# Patient Record
Sex: Female | Born: 1960 | Race: White | Hispanic: No | Smoking: Current every day smoker
Health system: Southern US, Community
[De-identification: ages and names within clinical notes are randomized; demographics above are authoritative.]

## PROBLEM LIST (undated history)

## (undated) HISTORY — PX: BREAST ENHANCEMENT SURGERY: SHX7

---

## 2016-12-08 ENCOUNTER — Ambulatory Visit (HOSPITAL_COMMUNITY)
Admission: EM | Admit: 2016-12-08 | Discharge: 2016-12-08 | Disposition: A | Discharge status: Home / Self Care | Payer: 59 | Attending: Family Medicine | Admitting: Family Medicine

## 2016-12-08 ENCOUNTER — Encounter (HOSPITAL_COMMUNITY): Payer: Self-pay | Admitting: *Deleted

## 2016-12-08 DIAGNOSIS — H6983 Other specified disorders of Eustachian tube, bilateral: Secondary | ICD-10-CM

## 2016-12-08 DIAGNOSIS — R42 Dizziness and giddiness: Secondary | ICD-10-CM | POA: Diagnosis not present

## 2016-12-08 MED ORDER — IPRATROPIUM BROMIDE 0.06 % NA SOLN: 2.0000 | Freq: Four times a day (QID) | NASAL | 0 refills | Status: AC

## 2016-12-08 MED ORDER — MECLIZINE HCL 25 MG PO TABS: 25.0000 mg | ORAL_TABLET | Freq: Three times a day (TID) | ORAL | 0 refills | Status: AC | PRN

## 2016-12-08 NOTE — ED Provider Notes (Signed)
  [facility]   [id number] 12/08/16 Arrival Time: 1705  ASSESSMENT & PLAN:  1. Dizziness   2. ETD (Eustachian tube dysfunction), bilateral     Meds ordered this encounter  Medications  . meclizine (ANTIVERT) 25 MG tablet    Sig: Take 1 tablet (25 mg total) by mouth 3 (three) times daily as needed for dizziness.    Dispense:  30 tablet    Refill:  0    Order Specific Question:   Supervising Provider    Answer:   Mardella LaymanHAGLER, BRIAN [id number]]  . ipratropium (ATROVENT) 0.06 % nasal spray    Sig: Place 2 sprays into both nostrils 4 (four) times daily.    Dispense:  15 mL    Refill:  0    Order Specific Question:   Supervising Provider    Answer:   Mardella LaymanHAGLER, BRIAN [[id number]]    Reviewed expectations re: course of current medical issues. Questions answered. Outlined signs and symptoms indicating need for more acute intervention. Patient verbalized understanding. After Visit Summary given.   SUBJECTIVE:  Julie Weeks is a 56 y.o. female who presents with complaint of bilateral ear discomfort, uri sx's, and dizziness.  ROS: As per HPI.   OBJECTIVE:  Vitals:   12/08/16 1802  BP: 114/66  Pulse: 83  Resp: 16  Temp: 98.2 F (36.8 C)  TempSrc: Oral  SpO2: 99%     General appearance: alert; no distress HEENT: normocephalic; atraumatic; conjunctivae normal; TMs normal; nasal mucosa normal; oral mucosa normal Neck: supple Lungs: clear to auscultation bilaterally Heart: regular rate and rhythm Abdomen: soft, non-tender; bowel sounds normal; no masses or organomegaly; no guarding or rebound tenderness Back: no CVA tenderness Extremities: no cyanosis or edema; symmetrical with no gross deformities Skin: warm and dry Neurologic: normal symmetric reflexes; normal gait Psychological:  alert and cooperative; normal mood and affect  No results found for this or any previous visit.  Labs Reviewed - No data to display  No results found.  No Known Allergies  PMHx,  SurgHx, SocialHx, Medications, and Allergies were reviewed in the Visit Navigator and updated as appropriate.      Julie Canter[place], Julie Weeks, [place]NP 12/08/16 [phone]

## 2016-12-08 NOTE — ED Triage Notes (Addendum)
Patient reports dizziness that started today, does not change with positions per patient. Stroke scale negative. Reports mild discomfort to bilateral ears.

## 2017-01-21 ENCOUNTER — Other Ambulatory Visit: Payer: Self-pay

## 2017-01-21 ENCOUNTER — Emergency Department (HOSPITAL_COMMUNITY): Payer: 59

## 2017-01-21 ENCOUNTER — Emergency Department (HOSPITAL_COMMUNITY)
Admission: EM | Admit: 2017-01-21 | Discharge: 2017-01-21 | Disposition: A | Discharge status: Home / Self Care | Payer: 59 | Attending: Physician Assistant | Admitting: Physician Assistant

## 2017-01-21 ENCOUNTER — Encounter (HOSPITAL_COMMUNITY): Payer: Self-pay | Admitting: Emergency Medicine

## 2017-01-21 DIAGNOSIS — F17210 Nicotine dependence, cigarettes, uncomplicated: Secondary | ICD-10-CM | POA: Diagnosis not present

## 2017-01-21 DIAGNOSIS — Z79899 Other long term (current) drug therapy: Secondary | ICD-10-CM | POA: Insufficient documentation

## 2017-01-21 DIAGNOSIS — R0789 Other chest pain: Secondary | ICD-10-CM

## 2017-01-21 DIAGNOSIS — R079 Chest pain, unspecified: Secondary | ICD-10-CM | POA: Diagnosis present

## 2017-01-21 LAB — CBC
HEMATOCRIT: 43.8 % (ref 36.0–46.0)
HEMOGLOBIN: 14.4 g/dL (ref 12.0–15.0)
MCH: 29.2 pg (ref 26.0–34.0)
MCHC: 32.9 g/dL (ref 30.0–36.0)
MCV: 88.8 fL (ref 78.0–100.0)
Platelets: 284 10*3/uL (ref 150–400)
RBC: 4.93 MIL/uL (ref 3.87–5.11)
RDW: 12.9 % (ref 11.5–15.5)
WBC: 6.3 10*3/uL (ref 4.0–10.5)

## 2017-01-21 LAB — BASIC METABOLIC PANEL
ANION GAP: 6 (ref 5–15)
BUN: 7 mg/dL (ref 6–20)
CO2: 27 mmol/L (ref 22–32)
Calcium: 9.5 mg/dL (ref 8.9–10.3)
Chloride: 104 mmol/L (ref 101–111)
Creatinine, Ser: 0.96 mg/dL (ref 0.44–1.00)
GFR calc Af Amer: >60 mL/min (ref >60)
GLUCOSE: 112 mg/dL — AB (ref 65–99)
Potassium: 3.8 mmol/L (ref 3.5–5.1)
Sodium: 137 mmol/L (ref 135–145)

## 2017-01-21 LAB — I-STAT TROPONIN, ED
TROPONIN I, POC: 0 ng/mL (ref 0.00–0.08)
Troponin i, poc: 0 ng/mL (ref 0.00–0.08)

## 2017-01-21 NOTE — Discharge Instructions (Signed)
Please follow-up with your primary care physician and you may need to see a cardiologist.  Please return with any chest pain or worsening symtpoms.   We are unsure what is causing your chest pain, at this time we do not believe that this is a heart attack. However things may change so please pay attention to your symptoms and return as needed.

## 2017-01-21 NOTE — ED Provider Notes (Signed)
[facility] Provider Note   CSN: [id number] Arrival date & time: 01/21/17  1138     History   Chief Complaint Chief Complaint  Patient presents with  . Chest Pain    HPI Julie Weeks is a 56 y.o. female.  HPI    Very pleasant 56 year old female presenting with twinges of chest pain. This happened first on Friday afternoon. Happen intermittently through the day. Lasting moments. Happened again today lasting about one hour. This happened right after a colleague came in and talked about something that "stressed her out"/. Patient has no history of hypertension. No hyperlipidemia. Patient is a smoker, no diabetes. She's had no cardiac history.  The chest pain did not radiate. No diaphoresis no shortness of breath.  No recent travel or immobilization longer than 4 hours.  History reviewed. No pertinent past medical history.  There are no active problems to display for this patient.   Past Surgical History:  Procedure Laterality Date  . BREAST ENHANCEMENT SURGERY    . CESAREAN SECTION      OB History    No data available       Home Medications    Prior to Admission medications   Medication Sig Start Date End Date Taking? Authorizing Provider  ipratropium (ATROVENT) 0.06 % nasal spray Place 2 sprays into both nostrils 4 (four) times daily. 12/08/16   Deatra Canter, FNP  meclizine (ANTIVERT) 25 MG tablet Take 1 tablet (25 mg total) by mouth 3 (three) times daily as needed for dizziness. 12/08/16   Deatra Canter, FNP    Family History No family history on file.  Social History Social History  Substance Use Topics  . Smoking status: Current Every Day Smoker    Packs/day: 1.00    Types: Cigarettes  . Smokeless tobacco: Never Used  . Alcohol use Yes     Comment: occ     Allergies   Patient has no known allergies.   Review of Systems Review of Systems  Constitutional: Negative for activity change, diaphoresis, fatigue and fever.    Respiratory: Negative for shortness of breath.   Cardiovascular: Positive for chest pain.  Gastrointestinal: Negative for abdominal pain.     Physical Exam Updated Vital Signs BP 106/84 (BP Location: Right Arm)   Pulse 94   Temp 98.4 F (36.9 C) (Oral)   Resp 18   Ht 5' 8.5" (1.74 m)   Wt 103 kg (227 lb)   SpO2 99%   BMI 34.01 kg/m   Physical Exam  Constitutional: She appears well-developed and well-nourished. No distress.  HENT:  Head: Normocephalic and atraumatic.  Eyes: Conjunctivae are normal.  Neck: Neck supple.  Cardiovascular: Normal rate and regular rhythm.   No murmur heard. Pulmonary/Chest: Effort normal and breath sounds normal. No respiratory distress.  Abdominal: Soft. There is no tenderness.  Musculoskeletal: She exhibits no edema.  Neurological: She is alert.  Skin: Skin is warm and dry.  Psychiatric: She has a normal mood and affect.  Nursing note and vitals reviewed.    ED Treatments / Results  Labs (all labs ordered are listed, but only abnormal results are displayed) Labs Reviewed  BASIC METABOLIC PANEL - Abnormal; Notable for the following:       Result Value   Glucose, Bld 112 (*)    All other components within normal limits  CBC  I-STAT TROPONIN, ED    EKG  EKG Interpretation  Date/Time:  Wednesday January 21 2017 11:50:13 EDT Ventricular Rate:  85 PR Interval:  156 QRS Duration: 78 QT Interval:  362 QTC Calculation: 430 R Axis:   74 Text Interpretation:  Normal sinus rhythm Normal ECG Normal sinus rhythm Confirmed by Corlis Leak, Saffron Busey [phone]) on 01/21/2017 3:43:08 PM       Radiology Dg Chest 2 View  Result Date: 01/21/2017 CLINICAL DATA:  Left "chest twinges" since Saturday - intermittent, lasting 1 hour at a time, smoker EXAM: CHEST  2 VIEW COMPARISON:  None. FINDINGS: Midline trachea. Normal heart size and mediastinal contours. No pleural effusion or pneumothorax. Clear lungs. EKG lead artifacts project over the lung  apices bilaterally. Diffuse peribronchial thickening. IMPRESSION: 1.  No acute cardiopulmonary disease. 2. Mild peribronchial thickening which may relate to chronic bronchitis or smoking. Electronically Signed   By: Jeronimo Greaves M.D.   On: 01/21/2017 12:20    Procedures Procedures (including critical care time)  Medications Ordered in ED Medications - No data to display   Initial Impression / Assessment and Plan / ED Course  I have reviewed the triage vital signs and the nursing notes.  Pertinent labs & imaging results that were available during my care of the patient were reviewed by me and considered in my medical decision making (see chart for details).     Very pleasant 56 year old female presenting with twinges of chest pain. This happened first on Friday afternoon. Happen intermittently through the day. Lasting moments. Happened again today lasting about one hour. This happened right after a colleague came in and talked about something that "stressed her out"/. Patient has no history of hypertension. No hyperlipidemia. Patient is a smoker, no diabetes. She's had no cardiac history.  The chest pain did not radiate. No diaphoresis no shortness of breath.  No recent travel or immobilization longer than 4 hours  3:58 PM Low heart score. Will delta trop. Patient does not have any risk factors for pulmonary embolism.  No CP currently. Will dicsharge with follow up with PCP.   Final Clinical Impressions(s) / ED Diagnoses   Final diagnoses:  None    New Prescriptions New Prescriptions   No medications on file     Abelino Derrick, MD 01/21/17 2314

## 2017-01-21 NOTE — ED Triage Notes (Signed)
Pt to ED c/o "chest twinges" since Saturday - intermittent, lasting 1 hour at a time, last happened this morning while patient was at work - states she has a very stressful job and feels anxiety may have something to do with it. Denies dizziness, no N/V, SOB. Denies pain at this time. Resp e/u, skin warm/dry.

## 2018-07-29 IMAGING — CR DG CHEST 2V
2 series · 2 of 2 positions shown · non-contrast
Comparison: None.

CLINICAL DATA: Left "chest twinges" since [REDACTED] - intermittent,
lasting 1 hour at a time, smoker

EXAM:
CHEST  2 VIEW

[chest pa]
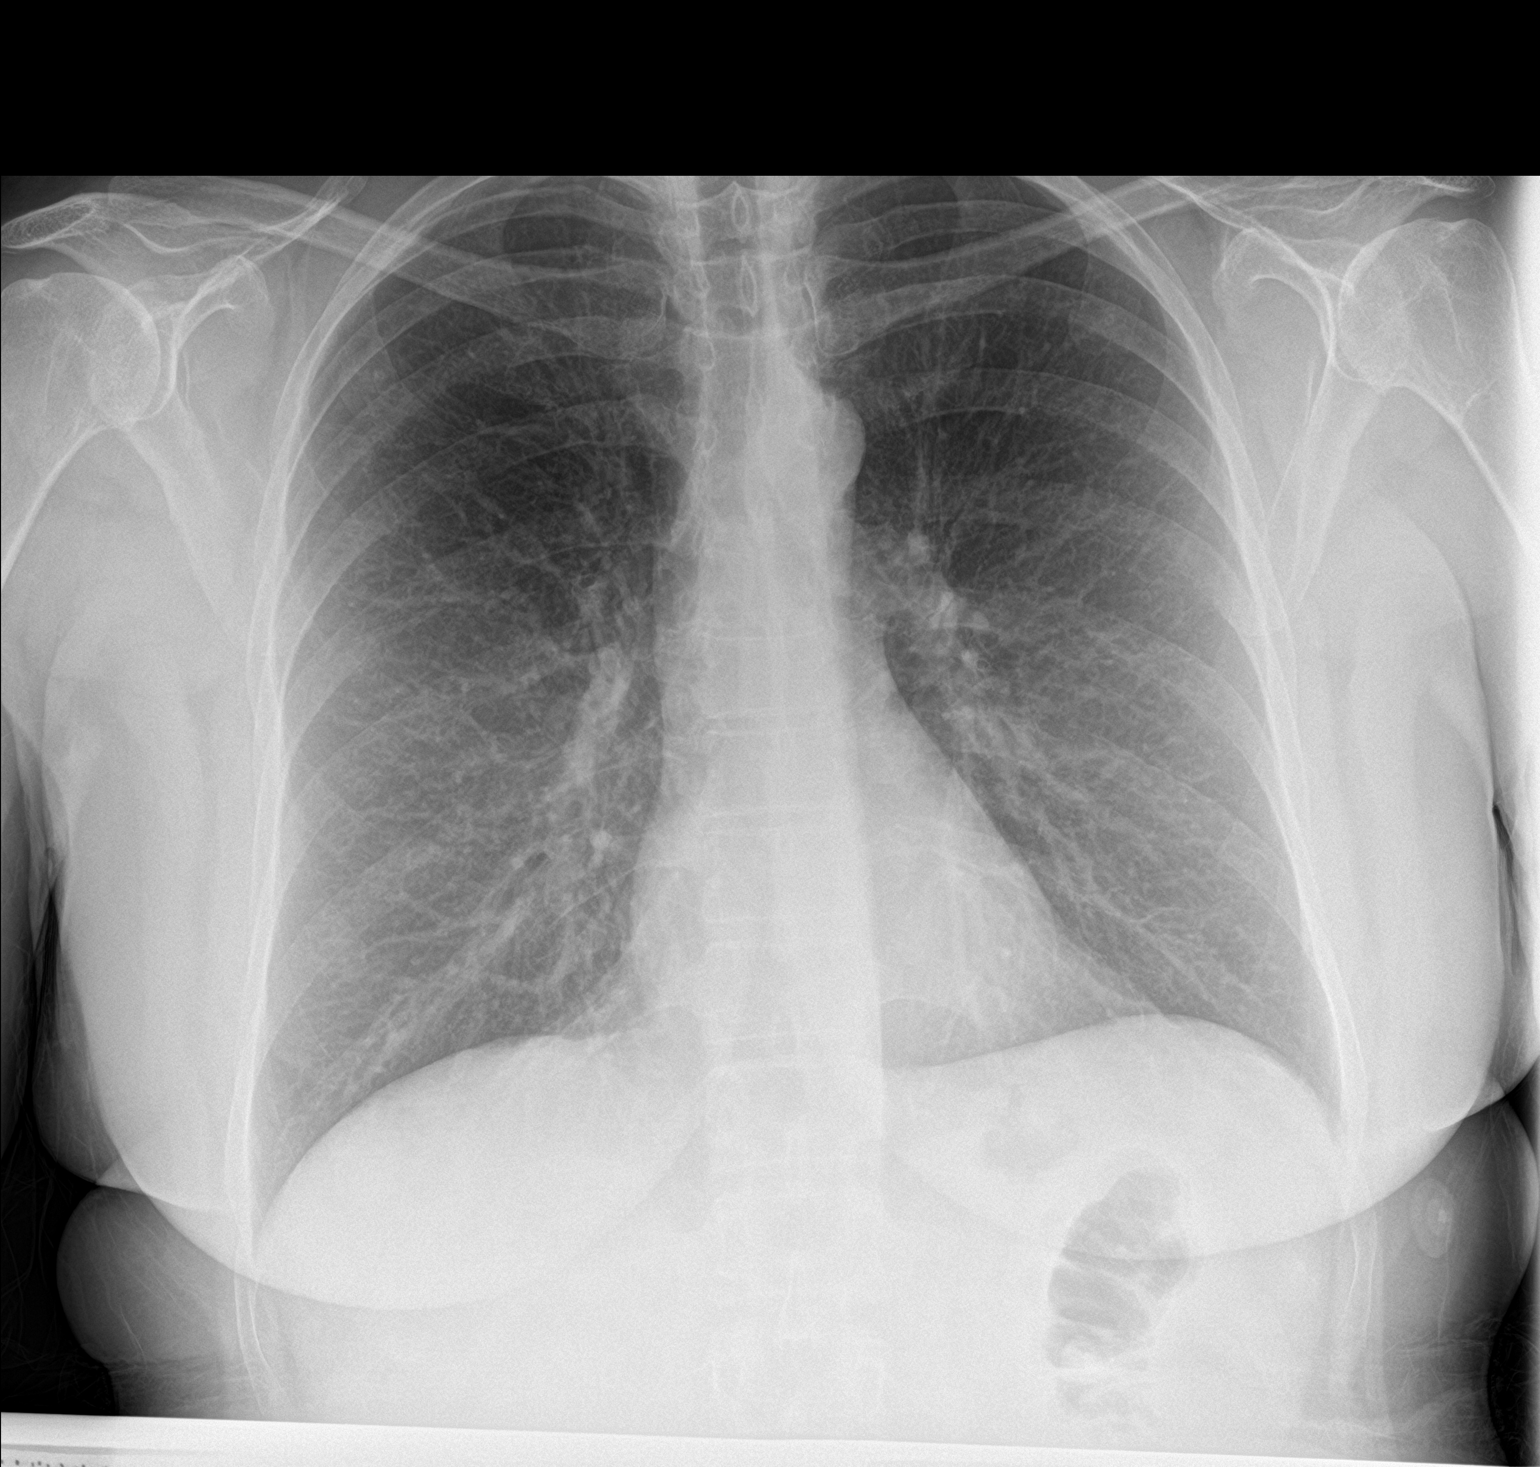

[chest lat]
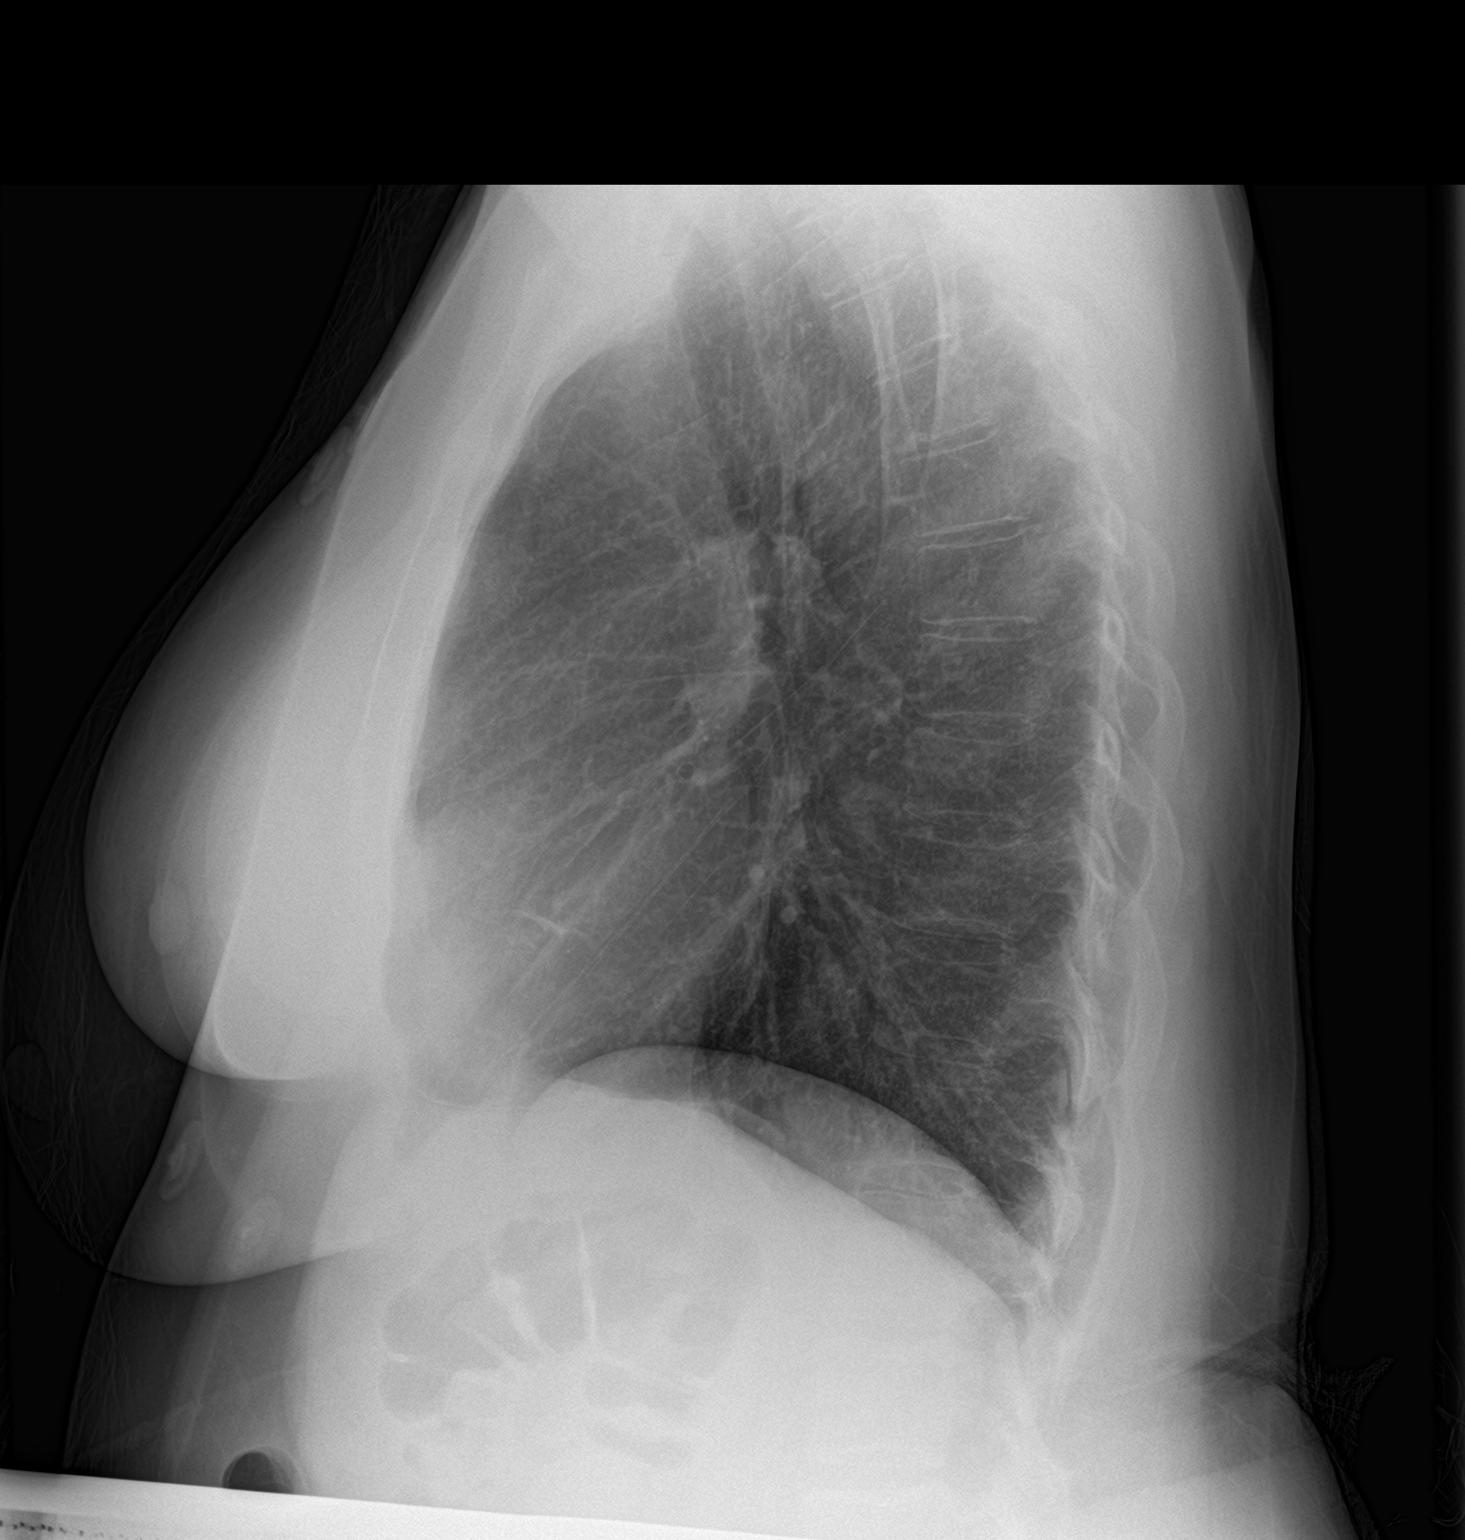

[2 of 2 positions shown; findings below may reference images not displayed]

FINDINGS: Midline trachea. Normal heart size and mediastinal contours. No
pleural effusion or pneumothorax. Clear lungs. EKG lead artifacts
project over the lung apices bilaterally. Diffuse peribronchial
thickening.
IMPRESSION: 1.  No acute cardiopulmonary disease.
2. Mild peribronchial thickening which may relate to chronic
bronchitis or smoking.
